# Patient Record
Sex: Male | Born: 1990 | Race: Black or African American | Hispanic: No | Marital: Single | Smoking: Current every day smoker
Health system: Southern US, Community
[De-identification: ages and names within clinical notes are randomized; demographics above are authoritative.]

---
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
## 2010-06-01 LAB — DIFFERENTIAL
Basophils Relative: 0 % (ref 0–1)
Eosinophils Absolute: 0.1 10*3/uL (ref 0.0–0.7)
Monocytes Relative: 9 % (ref 3–12)
Neutrophils Relative %: 41 % — ABNORMAL LOW (ref 43–77)

## 2010-06-01 LAB — COMPREHENSIVE METABOLIC PANEL
ALT: 17 U/L (ref 0–53)
Alkaline Phosphatase: 62 U/L (ref 39–117)
CO2: 26 mEq/L (ref 19–32)
Chloride: 106 mEq/L (ref 96–112)
GFR calc non Af Amer: >60 mL/min (ref >60)
Glucose, Bld: 92 mg/dL (ref 70–99)
Potassium: 3.9 mEq/L (ref 3.5–5.1)
Sodium: 139 mEq/L (ref 135–145)
Total Bilirubin: 1.2 mg/dL (ref 0.3–1.2)

## 2010-06-01 LAB — ETHANOL: Alcohol, Ethyl (B): <5 mg/dL (ref 0–10)

## 2010-06-01 LAB — CBC
Hemoglobin: 14.8 g/dL (ref 13.0–17.0)
RBC: 4.84 MIL/uL (ref 4.22–5.81)

## 2010-06-01 LAB — RAPID URINE DRUG SCREEN, HOSP PERFORMED: Tetrahydrocannabinol: POSITIVE — AB

## 2011-06-26 ENCOUNTER — Emergency Department: Payer: Self-pay | Admitting: *Deleted

## 2014-06-26 ENCOUNTER — Emergency Department
Admission: EM | Admit: 2014-06-26 | Discharge: 2014-06-26 | Disposition: A | Discharge status: Home / Self Care | Payer: BLUE CROSS/BLUE SHIELD | Attending: Emergency Medicine | Admitting: Emergency Medicine

## 2014-06-26 ENCOUNTER — Emergency Department: Payer: BLUE CROSS/BLUE SHIELD

## 2014-06-26 ENCOUNTER — Other Ambulatory Visit: Payer: Self-pay

## 2014-06-26 DIAGNOSIS — R079 Chest pain, unspecified: Secondary | ICD-10-CM | POA: Diagnosis not present

## 2014-06-26 DIAGNOSIS — R0602 Shortness of breath: Secondary | ICD-10-CM | POA: Insufficient documentation

## 2014-06-26 LAB — CBC
HCT: 43.6 % (ref 40.0–52.0)
HEMOGLOBIN: 14.7 g/dL (ref 13.0–18.0)
MCH: 30.6 pg (ref 26.0–34.0)
MCHC: 33.8 g/dL (ref 32.0–36.0)
MCV: 90.5 fL (ref 80.0–100.0)
Platelets: 230 10*3/uL (ref 150–440)
RBC: 4.81 MIL/uL (ref 4.40–5.90)
RDW: 13.4 % (ref 11.5–14.5)
WBC: 10 10*3/uL (ref 3.8–10.6)

## 2014-06-26 LAB — BASIC METABOLIC PANEL
ANION GAP: 14 (ref 5–15)
BUN: 8 mg/dL (ref 6–20)
CALCIUM: 9.6 mg/dL (ref 8.9–10.3)
CHLORIDE: 100 mmol/L — AB (ref 101–111)
CO2: 23 mmol/L (ref 22–32)
CREATININE: 0.91 mg/dL (ref 0.61–1.24)
GFR calc non Af Amer: >60 mL/min (ref >60)
Glucose, Bld: 128 mg/dL — ABNORMAL HIGH (ref 65–99)
Potassium: 4.2 mmol/L (ref 3.5–5.1)
Sodium: 137 mmol/L (ref 135–145)

## 2014-06-26 LAB — TROPONIN I: Troponin I: <0.03 ng/mL (ref <0.031)

## 2014-06-26 MED ORDER — ASPIRIN 81 MG PO CHEW
324.0000 mg | CHEWABLE_TABLET | Freq: Once | ORAL | Status: AC
  Administered 2014-06-26: 324 mg via ORAL

## 2014-06-26 MED ORDER — ASPIRIN 81 MG PO CHEW
CHEWABLE_TABLET | ORAL | Status: AC
  Administered 2014-06-26: 324 mg via ORAL
  Filled 2014-06-26: qty 4

## 2014-06-26 NOTE — ED Notes (Signed)
   06/26/14 0320  Neurological  Neuro ([facility]) X  Wide eyed appearance noted, bilateral arm stiffness noted. Pt will follow simple commands and answer questions.

## 2014-06-26 NOTE — Discharge Instructions (Signed)
Chest Pain Observation  Please call Dr. Val EagleArita's office for follow-up.  Return to the ER right away should her chest pain recur, you have trouble breathing or shortness of breath, he feel weak, he developed nausea vomiting or sweating, or other new concerns or symptoms arise.  It is often hard to give a specific diagnosis for the cause of chest pain. Among other possibilities your symptoms might be caused by inadequate oxygen delivery to your heart (angina). Angina that is not treated or evaluated can lead to a heart attack (myocardial infarction) or death. Blood tests, electrocardiograms, and X-rays may have been done to help determine a possible cause of your chest pain. After evaluation and observation, your health care provider has determined that it is unlikely your pain was caused by an unstable condition that requires hospitalization. However, a full evaluation of your pain may need to be completed, with additional diagnostic testing as directed. It is very important to keep your follow-up appointments. Not keeping your follow-up appointments could result in permanent heart damage, disability, or death. If there is any problem keeping your follow-up appointments, you must call your health care provider. HOME CARE INSTRUCTIONS  Due to the slight chance that your pain could be angina, it is important to follow your health care provider's treatment plan and also maintain a healthy lifestyle:  Maintain or work toward achieving a healthy weight.  Stay physically active and exercise regularly.  Decrease your salt intake.  Eat a balanced, healthy diet. Talk to a dietitian to learn about heart-healthy foods.  Increase your fiber intake by including whole grains, vegetables, fruits, and nuts in your diet.  Avoid situations that cause stress, anger, or depression.  Take medicines as advised by your health care provider. Report any side effects to your health care provider. Do not stop medicines or  adjust the dosages on your own.  Quit smoking. Do not use nicotine patches or gum until you check with your health care provider.  Keep your blood pressure, blood sugar, and cholesterol levels within normal limits.  Limit alcohol intake to no more than 1 drink per day for women who are not pregnant and 2 drinks per day for men.  Do not abuse drugs. SEEK IMMEDIATE MEDICAL CARE IF: You have severe chest pain or pressure which may include symptoms such as:  You feel pain or pressure in your arms, neck, jaw, or back.  You have severe back or abdominal pain, feel sick to your stomach (nauseous), or throw up (vomit).  You are sweating profusely.  You are having a fast or irregular heartbeat.  You feel short of breath while at rest.  You notice increasing shortness of breath during rest, sleep, or with activity.  You have chest pain that does not get better after rest or after taking your usual medicine.  You wake from sleep with chest pain.  You are unable to sleep because you cannot breathe.  You develop a frequent cough or you are coughing up blood.  You feel dizzy, faint, or experience extreme fatigue.  You develop severe weakness, dizziness, fainting, or chills. Any of these symptoms may represent a serious problem that is an emergency. Do not wait to see if the symptoms will go away. Call your local emergency services (911 in the [place] Do not drive yourself to the hospital. MAKE SURE YOU:  Understand these instructions.  Will watch your condition.  Will get help right away if you are not doing well or get worse.  Document Released: 03/11/2010 Document Revised: 02/11/2013 Document Reviewed: 08/08/2012 [facility] Patient Information 2015 [employer], [place]. This information is not intended to replace advice given to you by your health care provider. Make sure you discuss any questions you have with your health care provider.

## 2014-06-26 NOTE — ED Provider Notes (Signed)
[facility] Emergency Department Provider Note    ____________________________________________  Time seen: 3:15 AM  I have reviewed the triage vital signs and the nursing notes.   HISTORY  Chief Complaint Chest Pain and Shortness of Breath       HPI Brian Jefferson is a 24 y.o. male who began having chest pain and shortness of breath around 9 PM. Patient reports that he was feeling a little stressed out today regarding situations with unemployment and try to find work, in order to combat this CT took a single pill from a friend which she believes to be Suboxone. He then decided to go to bed, but while getting ready for bed he began to experience a sudden feeling of shortness of breath and tightness across the front of his chest which began at 9 PM. He states he was unable to sleep and became very anxious and nervous about this and presents to the ER for evaluation of this chest pain.  Patient states that he does feel this is probably related to anxiety and lots of stress in his life recently regarding finances and jobs. He denies any homicidal or suicidal thoughts and denies any self harming.  Patient states he is not a regular drug user, but thought he would try something to help him relax tonight. He feels that the Suboxone may have made him feel funny and potentially made him and neck.  Patient states that pain is gone now and his shortness of breath has resolved. He felt an achy tight sensation across the front of his chest with some mild to moderate shortness of breath that lasted about one hour. The severity was described as moderate to severe. Pressure. Denies sweating, vomiting, abdominal pain, or confusion.  On further discussion the patient states that he made a poor decision and will certainly not ever take any unprescribed medications for reasons like this in the future.   No past medical history on file. Patient denies any past medical  history  Patient does not take any medications  Patient denies any drug allergies  Patient does smoke tobacco products occasionally, he does not use any illegal substances except the Suboxone which he told me about. He does drink alcohol approximately 3-5 beers daily, has no history of withdrawal or alcoholism per patient.  There are no active problems to display for this patient.   No past surgical history on file.  No current outpatient prescriptions on file.  Allergies Review of patient's allergies indicates no known allergies.  No family history on file.  Social History History  Substance Use Topics  . Smoking status: Not on file  . Smokeless tobacco: Not on file  . Alcohol Use: Not on file    Review of Systems  Constitutional: Negative for fever. Eyes: Negative for visual changes. ENT: Negative for sore throat. Cardiovascular: See history of present illness Respiratory: See history of present illness Gastrointestinal: Negative for abdominal pain, vomiting and diarrhea. Genitourinary: Negative for dysuria. Musculoskeletal: Negative for back pain. Skin: Negative for rash. Neurological: Negative for headaches, focal weakness or numbness. PERC negative review of systems also.  10-point ROS otherwise negative.  ____________________________________________   PHYSICAL EXAM:  VITAL SIGNS: ED Triage Vitals  Enc Vitals Group     BP 06/26/14 0308 165/110 mmHg     Pulse Rate 06/26/14 0308 85     Resp 06/26/14 0308 22     Temp 06/26/14 0308 97.9 F (36.6 C)     Temp Source  06/26/14 0308 Oral     SpO2 06/26/14 0308 99 %     Weight 06/26/14 0308 195 lb (88.451 kg)     Height 06/26/14 0308  (1.753 m)     Head Cir --      Peak Flow --      Pain Score 06/26/14 0309 9     Pain Loc --      Pain Edu? --      Excl. in GC? --     Constitutional: Alert and oriented. Well appearing and in no distress. Patient does appear slightly anxious at this time and admits  that he does feel a little anxious. Eyes: Conjunctivae are normal. PERRL. Normal extraocular movements. ENT   Head: Normocephalic and atraumatic.   Nose: No congestion/rhinnorhea.   Mouth/Throat: Mucous membranes are moist.   Neck: No stridor. Hematological/Lymphatic/Immunilogical: No cervical lymphadenopathy. Cardiovascular: Normal rate, regular rhythm. Normal and symmetric distal pulses are present in all extremities. No murmurs, rubs, or gallops. Respiratory: Normal respiratory effort without tachypnea nor retractions. Breath sounds are clear and equal bilaterally. No wheezes/rales/rhonchi. Gastrointestinal: Soft and nontender. No distention. No abdominal bruits. There is no CVA tenderness. Genitourinary:  Musculoskeletal: Nontender with normal range of motion in all extremities. No joint effusions.  No lower extremity tenderness nor edema. Neurologic:  Normal speech and language except for occasional stuttering, which patient states is chronic.Marland Kitchen No gross focal neurologic deficits are appreciated. Speech is normal. No gait instability. Skin:  Skin is warm, dry and intact. No rash noted. Psychiatric: Mood and affect are normal. Speech and behavior are normal. Patient exhibits appropriate insight and judgment.  ____________________________________________    LABS (pertinent positives/negatives)  Results for orders placed or performed during the hospital encounter of 06/26/14  CBC  Result Value Ref Range   WBC 10.0 3.8 - 10.6 K/uL   RBC 4.81 4.40 - 5.90 MIL/uL   Hemoglobin 14.7 13.0 - 18.0 g/dL   HCT 16.1 09.6 - 04.5 %   MCV 90.5 80.0 - 100.0 fL   MCH 30.6 26.0 - 34.0 pg   MCHC 33.8 32.0 - 36.0 g/dL   RDW 40.9 81.1 - 91.4 %   Platelets 230 150 - 440 K/uL  Basic metabolic panel  Result Value Ref Range   Sodium 137 135 - 145 mmol/L   Potassium 4.2 3.5 - 5.1 mmol/L   Chloride 100 (L) 101 - 111 mmol/L   CO2 23 22 - 32 mmol/L   Glucose, Bld 128 (H) 65 - 99 mg/dL   BUN  8 6 - 20 mg/dL   Creatinine, Ser 7.82 0.61 - 1.24 mg/dL   Calcium 9.6 8.9 - 95.6 mg/dL   GFR calc non Af Amer >60 >60 mL/min   GFR calc Af Amer >60 >60 mL/min   Anion gap 14 5 - 15  Troponin I  Result Value Ref Range   Troponin I <0.03 <0.031 ng/mL     ____________________________________________   EKG  EKG is very normal. Normal sinus rhythm normal intervals normal axis, ventricular rate 78 PR interval 124 QRS 80 QTc is 420 there are no ischemic changes noted.  ____________________________________________    RADIOLOGY  Normal chest film   ____________________________________________   PROCEDURES  Procedure(s) performed: None  Critical Care performed: No  ____________________________________________   INITIAL IMPRESSION / ASSESSMENT AND PLAN / ED COURSE  Pertinent labs & imaging results that were available during my care of the patient were reviewed by me and considered in my medical decision  making (see chart for details).  Patient presents with acute onset of brief and self resolving chest tightness and dyspnea in the setting of feeling stressed out and abusing a single Suboxone tablet.  Patient denies strong family history of coronary disease, has no personal history of coronary disease, his chest pain is completely resolved at 3:40 AM in the ER, he is noted to have a normal EKG.  Patient is in no apparent distress and well-appearing, asymptomatic in the ER at this time. Patient is low risk by heart score for coronary disease with a score less than 3, given a normal troponin which is currently pending.  The patient is perk negative has no evidence of respiratory distress, he is well-appearing. No evidence to support pulmonary embolism with a PE RC negative review of systems.  My plan will be to check a troponin and continued observation in the ER. If troponin is normal we will discharge him and I did discuss with him follow-up with outpatient cardiology. Patient  is agreeable to this plan.  I discussed with the patient the return precautions are very important and should his chest pain or shortness of breath recur or he would have other symptoms like fever or lightheadedness cough or other concerns he should return to the ER right away for further evaluation. Patient is agreeable.  ----------------------------------------- 4:13 AM on 06/26/2014 -----------------------------------------  Patient remains asymptomatic this time. Patient's labs are notably normal, his troponin was normal. EKG is normal.  I called and discussed with [facility] cardiology MD on-call, reviewed patient presentation past medical history, labs in the ER, EKG. As patient is low risk and presently asymptomatic we'll have him follow-up as an outpatient with cardiology for evaluation in their clinic.    ____________________________________________   FINAL CLINICAL IMPRESSION(S) / ED DIAGNOSES  Final diagnoses:  Chest pain, unspecified chest pain type     Sharyn CreamerMark Quale, MD 07/23/14 0050

## 2014-06-26 NOTE — ED Notes (Signed)
   06/26/14 0320  Cardiac  Cardiac ([facility]) X  Cardiac Rhythm NSR  Pt c/o "pounding in chest" after taking Suboxone pill at 5pm. Pt reports buying pill from someone. Pt states he has never taken this drug before. Pt c/o "feeling anxious and short of breath.

## 2014-06-26 NOTE — ED Notes (Signed)
Pt presents to ED with c/o chest pressure and sob for the past hour. Pt states he felt like his heart was "pounding hard" in his chest. Pt currently has no increased work of breathing or acute distress noted. Skin warm and dry. Pt states he is "Scared"; appears anxious. No hx of the same.

## 2014-07-28 ENCOUNTER — Ambulatory Visit: Payer: BLUE CROSS/BLUE SHIELD | Admitting: Cardiovascular Disease

## 2014-08-13 ENCOUNTER — Emergency Department: Payer: BLUE CROSS/BLUE SHIELD

## 2014-08-13 ENCOUNTER — Encounter: Payer: Self-pay | Admitting: Urgent Care

## 2014-08-13 ENCOUNTER — Emergency Department
Admission: EM | Admit: 2014-08-13 | Discharge: 2014-08-13 | Disposition: A | Discharge status: Home / Self Care | Payer: BLUE CROSS/BLUE SHIELD | Attending: Emergency Medicine | Admitting: Emergency Medicine

## 2014-08-13 ENCOUNTER — Other Ambulatory Visit: Payer: Self-pay

## 2014-08-13 DIAGNOSIS — Z720 Tobacco use: Secondary | ICD-10-CM | POA: Insufficient documentation

## 2014-08-13 DIAGNOSIS — F1010 Alcohol abuse, uncomplicated: Secondary | ICD-10-CM | POA: Insufficient documentation

## 2014-08-13 DIAGNOSIS — F32A Depression, unspecified: Secondary | ICD-10-CM

## 2014-08-13 DIAGNOSIS — R609 Edema, unspecified: Secondary | ICD-10-CM

## 2014-08-13 DIAGNOSIS — F329 Major depressive disorder, single episode, unspecified: Secondary | ICD-10-CM | POA: Diagnosis not present

## 2014-08-13 LAB — CBC WITH DIFFERENTIAL/PLATELET
BASOS ABS: 0 10*3/uL (ref 0–0.1)
Basophils Relative: 1 %
Eosinophils Absolute: 0.4 10*3/uL (ref 0–0.7)
Eosinophils Relative: 5 %
HEMATOCRIT: 41.5 % (ref 40.0–52.0)
HEMOGLOBIN: 13.5 g/dL (ref 13.0–18.0)
LYMPHS PCT: 42 %
Lymphs Abs: 3 10*3/uL (ref 1.0–3.6)
MCH: 30.1 pg (ref 26.0–34.0)
MCHC: 32.5 g/dL (ref 32.0–36.0)
MCV: 92.3 fL (ref 80.0–100.0)
MONO ABS: 0.7 10*3/uL (ref 0.2–1.0)
MONOS PCT: 10 %
NEUTROS ABS: 3.1 10*3/uL (ref 1.4–6.5)
Neutrophils Relative %: 42 %
Platelets: 194 10*3/uL (ref 150–440)
RBC: 4.49 MIL/uL (ref 4.40–5.90)
RDW: 13.4 % (ref 11.5–14.5)
WBC: 7.2 10*3/uL (ref 3.8–10.6)

## 2014-08-13 LAB — TROPONIN I: Troponin I: <0.03 ng/mL (ref <0.031)

## 2014-08-13 LAB — COMPREHENSIVE METABOLIC PANEL
ALK PHOS: 84 U/L (ref 38–126)
ALT: 72 U/L — ABNORMAL HIGH (ref 17–63)
ANION GAP: 5 (ref 5–15)
AST: 58 U/L — ABNORMAL HIGH (ref 15–41)
Albumin: 4 g/dL (ref 3.5–5.0)
BILIRUBIN TOTAL: 0.8 mg/dL (ref 0.3–1.2)
BUN: 7 mg/dL (ref 6–20)
CHLORIDE: 103 mmol/L (ref 101–111)
CO2: 29 mmol/L (ref 22–32)
CREATININE: 0.96 mg/dL (ref 0.61–1.24)
Calcium: 9.1 mg/dL (ref 8.9–10.3)
Glucose, Bld: 100 mg/dL — ABNORMAL HIGH (ref 65–99)
POTASSIUM: 4.2 mmol/L (ref 3.5–5.1)
Sodium: 137 mmol/L (ref 135–145)
Total Protein: 7.8 g/dL (ref 6.5–8.1)

## 2014-08-13 LAB — URINALYSIS COMPLETE WITH MICROSCOPIC (ARMC ONLY)
BACTERIA UA: NONE SEEN
Bilirubin Urine: NEGATIVE
Glucose, UA: NEGATIVE mg/dL
HGB URINE DIPSTICK: NEGATIVE
KETONES UR: NEGATIVE mg/dL
Leukocytes, UA: NEGATIVE
Nitrite: NEGATIVE
PROTEIN: NEGATIVE mg/dL
RBC / HPF: NONE SEEN RBC/hpf (ref 0–5)
SPECIFIC GRAVITY, URINE: 1.016 (ref 1.005–1.030)
Squamous Epithelial / LPF: NONE SEEN
pH: 5 (ref 5.0–8.0)

## 2014-08-13 LAB — PROTIME-INR
INR: 0.91
Prothrombin Time: 12.5 seconds (ref 11.4–15.0)

## 2014-08-13 LAB — BRAIN NATRIURETIC PEPTIDE: B Natriuretic Peptide: 108 pg/mL — ABNORMAL HIGH (ref 0.0–100.0)

## 2014-08-13 LAB — URIC ACID: URIC ACID, SERUM: 7.3 mg/dL (ref 4.4–7.6)

## 2014-08-13 MED ORDER — FUROSEMIDE 20 MG PO TABS: 20.0000 mg | ORAL_TABLET | Freq: Every day | ORAL | Status: AC

## 2014-08-13 NOTE — ED Notes (Signed)
Patient presents with c/o 1-2 months of his feet and ankles swelling. NOS reported at this time.

## 2014-08-13 NOTE — ED Notes (Signed)
Spoke with Ladona Ridgel, MD regarding patient's presenting c/o and triage assessment. MD with no orders at this time.

## 2014-08-13 NOTE — Discharge Instructions (Signed)
Alcohol Use Disorder °Alcohol use disorder is a mental disorder. It is not a one-time incident of heavy drinking. Alcohol use disorder is the excessive and uncontrollable use of alcohol over time that leads to problems with functioning in one or more areas of daily living. People with this disorder risk harming themselves and others when they drink to excess. Alcohol use disorder also can cause other mental disorders, such as mood and anxiety disorders, and serious physical problems. People with alcohol use disorder often misuse other drugs.  °Alcohol use disorder is common and widespread. Some people with this disorder drink alcohol to cope with or escape from negative life events. Others drink to relieve chronic pain or symptoms of mental illness. People with a family history of alcohol use disorder are at higher risk of losing control and using alcohol to excess.  °SYMPTOMS  °Signs and symptoms of alcohol use disorder may include the following:  °· Consumption of alcohol in larger amounts or over a longer period of time than intended. °· Multiple unsuccessful attempts to cut down or control alcohol use.   °· A great deal of time spent obtaining alcohol, using alcohol, or recovering from the effects of alcohol (hangover). °· A strong desire or urge to use alcohol (cravings).   °· Continued use of alcohol despite problems at work, school, or home because of alcohol use.   °· Continued use of alcohol despite problems in relationships because of alcohol use. °· Continued use of alcohol in situations when it is physically hazardous, such as driving a car. °· Continued use of alcohol despite awareness of a physical or psychological problem that is likely related to alcohol use. Physical problems related to alcohol use can involve the brain, heart, liver, stomach, and intestines. Psychological problems related to alcohol use include intoxication, depression, anxiety, psychosis, delirium, and dementia.   °· The need for  increased amounts of alcohol to achieve the same desired effect, or a decreased effect from the consumption of the same amount of alcohol (tolerance). °· Withdrawal symptoms upon reducing or stopping alcohol use, or alcohol use to reduce or avoid withdrawal symptoms. Withdrawal symptoms include: °· Racing heart. °· Hand tremor. °· Difficulty sleeping. °· Nausea. °· Vomiting. °· Hallucinations. °· Restlessness. °· Seizures. °DIAGNOSIS °Alcohol use disorder is diagnosed through an assessment by your health care provider. Your health care provider may start by asking three or four questions to screen for excessive or problematic alcohol use. To confirm a diagnosis of alcohol use disorder, at least two symptoms must be present within a 12-month period. The severity of alcohol use disorder depends on the number of symptoms: °· Mild--two or three. °· Moderate--four or five. °· Severe--six or more. °Your health care provider may perform a physical exam or use results from lab tests to see if you have physical problems resulting from alcohol use. Your health care provider may refer you to a mental health professional for evaluation. °TREATMENT  °Some people with alcohol use disorder are able to reduce their alcohol use to low-risk levels. Some people with alcohol use disorder need to quit drinking alcohol. When necessary, mental health professionals with specialized training in substance use treatment can help. Your health care provider can help you decide how severe your alcohol use disorder is and what type of treatment you need. The following forms of treatment are available:  °· Detoxification. Detoxification involves the use of prescription medicines to prevent alcohol withdrawal symptoms in the first week after quitting. This is important for people with a history of symptoms   of withdrawal and for heavy drinkers who are likely to have withdrawal symptoms. Alcohol withdrawal can be dangerous and, in severe cases, cause  death. Detoxification is usually provided in a hospital or in-patient substance use treatment facility.  Counseling or talk therapy. Talk therapy is provided by substance use treatment counselors. It addresses the reasons people use alcohol and ways to keep them from drinking again. The goals of talk therapy are to help people with alcohol use disorder find healthy activities and ways to cope with life stress, to identify and avoid triggers for alcohol use, and to handle cravings, which can cause relapse.  Medicines.Different medicines can help treat alcohol use disorder through the following actions:  Decrease alcohol cravings.  Decrease the positive reward response felt from alcohol use.  Produce an uncomfortable physical reaction when alcohol is used (aversion therapy).  Support groups. Support groups are run by people who have quit drinking. They provide emotional support, advice, and guidance. These forms of treatment are often combined. Some people with alcohol use disorder benefit from intensive combination treatment provided by specialized substance use treatment centers. Both inpatient and outpatient treatment programs are available. Document Released: 03/16/2004 Document Revised: 06/23/2013 Document Reviewed: 05/16/2012 [facility] Patient Information 2015 [place], [place]. This information is not intended to replace advice given to you by your health care provider. Make sure you discuss any questions you have with your health care provider.  Depression Depression refers to feeling sad, low, down in the dumps, blue, gloomy, or empty. In general, there are two kinds of depression:  Normal sadness or normal grief. This kind of depression is one that we all feel from time to time after upsetting life experiences, such as the loss of a job or the ending of a relationship. This kind of depression is considered normal, is short lived, and resolves within a few days to 2 weeks. Depression  experienced after the loss of a loved one (bereavement) often lasts longer than 2 weeks but normally gets better with time.  Clinical depression. This kind of depression lasts longer than normal sadness or normal grief or interferes with your ability to function at home, at work, and in school. It also interferes with your personal relationships. It affects almost every aspect of your life. Clinical depression is an illness. Symptoms of depression can also be caused by conditions other than those mentioned above, such as:  Physical illness. Some physical illnesses, including underactive thyroid gland (hypothyroidism), severe anemia, specific types of cancer, diabetes, uncontrolled seizures, heart and lung problems, strokes, and chronic pain are commonly associated with symptoms of depression.  Side effects of some prescription medicine. In some people, certain types of medicine can cause symptoms of depression.  Substance abuse. Abuse of alcohol and illicit drugs can cause symptoms of depression. SYMPTOMS Symptoms of normal sadness and normal grief include the following:  Feeling sad or crying for short periods of time.  Not caring about anything (apathy).  Difficulty sleeping or sleeping too much.  No longer able to enjoy the things you used to enjoy.  Desire to be by oneself all the time (social isolation).  Lack of energy or motivation.  Difficulty concentrating or remembering.  Change in appetite or weight.  Restlessness or agitation. Symptoms of clinical depression include the same symptoms of normal sadness or normal grief and also the following symptoms:  Feeling sad or crying all the time.  Feelings of guilt or worthlessness.  Feelings of hopelessness or helplessness.  Thoughts of suicide or  the desire to harm yourself (suicidal ideation).  Loss of touch with reality (psychotic symptoms). Seeing or hearing things that are not real (hallucinations) or having false  beliefs about your life or the people around you (delusions and paranoia). DIAGNOSIS  The diagnosis of clinical depression is usually based on how bad the symptoms are and how long they have lasted. Your health care provider will also ask you questions about your medical history and substance use to find out if physical illness, use of prescription medicine, or substance abuse is causing your depression. Your health care provider may also order blood tests. TREATMENT  Often, normal sadness and normal grief do not require treatment. However, sometimes antidepressant medicine is given for bereavement to ease the depressive symptoms until they resolve. The treatment for clinical depression depends on how bad the symptoms are but often includes antidepressant medicine, counseling with a mental health professional, or both. Your health care provider will help to determine what treatment is best for you. Depression caused by physical illness usually goes away with appropriate medical treatment of the illness. If prescription medicine is causing depression, talk with your health care provider about stopping the medicine, decreasing the dose, or changing to another medicine. Depression caused by the abuse of alcohol or illicit drugs goes away when you stop using these substances. Some adults need professional help in order to stop drinking or using drugs. SEEK IMMEDIATE MEDICAL CARE IF:  You have thoughts about hurting yourself or others.  You lose touch with reality (have psychotic symptoms).  You are taking medicine for depression and have a serious side effect. FOR MORE INFORMATION  National Alliance on Mental Illness: www.nami.[employer] of Mental Health: http://www.maynard.net/ Document Released: 02/04/2000 Document Revised: 06/23/2013 Document Reviewed: 05/08/2011 [facility] Patient Information 2015 [place], [place]. This information is not intended to replace advice given to you by your  health care provider. Make sure you discuss any questions you have with your health care provider.  Peripheral Edema You have swelling in your legs (peripheral edema). This swelling is due to excess accumulation of salt and water in your body. Edema may be a sign of heart, kidney or liver disease, or a side effect of a medication. It may also be due to problems in the leg veins. Elevating your legs and using special support stockings may be very helpful, if the cause of the swelling is due to poor venous circulation. Avoid long periods of standing, whatever the cause. Treatment of edema depends on identifying the cause. Chips, pretzels, pickles and other salty foods should be avoided. Restricting salt in your diet is almost always needed. Water pills (diuretics) are often used to remove the excess salt and water from your body via urine. These medicines prevent the kidney from reabsorbing sodium. This increases urine flow. Diuretic treatment may also result in lowering of potassium levels in your body. Potassium supplements may be needed if you have to use diuretics daily. Daily weights can help you keep track of your progress in clearing your edema. You should call your caregiver for follow up care as recommended. SEEK IMMEDIATE MEDICAL CARE IF:   You have increased swelling, pain, redness, or heat in your legs.  You develop shortness of breath, especially when lying down.  You develop chest or abdominal pain, weakness, or fainting.  You have a fever. Document Released: 03/16/2004 Document Revised: 05/01/2011 Document Reviewed: 02/24/2009 [facility] Patient Information 2015 [place], [place]. This information is not intended to replace advice given to  you by your health care provider. Make sure you discuss any questions you have with your health care provider.

## 2014-08-13 NOTE — ED Provider Notes (Signed)
[facility] Emergency Department Provider Note     Time seen: ----------------------------------------- 8:03 AM on 08/13/2014 -----------------------------------------    I have reviewed the triage vital signs and the nursing notes.   HISTORY  Chief Complaint Foot Swelling    HPI Brian Jefferson is a 24 y.o. male since ER for bilateral lower extremity swelling over the last month. Patient states it comes and goes, does have some shortness of breath and chest pain. Patient denies fevers chills, nothing sees make his symptoms better or worse. States he also drinks 2, 40 ounce beers per day.   History reviewed. No pertinent past medical history.  There are no active problems to display for this patient.   History reviewed. No pertinent past surgical history.  Allergies Review of patient's allergies indicates no known allergies.  Social History History  Substance Use Topics  . Smoking status: Current Every Day Smoker  . Smokeless tobacco: Not on file  . Alcohol Use: Yes    Review of Systems Constitutional: Negative for fever. Eyes: Negative for visual changes. ENT: Negative for sore throat. Cardiovascular: Negative for chest pain. Respiratory: Negative for shortness of breath. Gastrointestinal: Negative for abdominal pain, vomiting and diarrhea. Genitourinary: Negative for dysuria. Musculoskeletal: Negative for back pain. Positive for leg swelling bilaterally Skin: Negative for rash. Neurological: Negative for headaches, focal weakness or numbness.  10-point ROS otherwise negative.  ____________________________________________   PHYSICAL EXAM:  VITAL SIGNS: ED Triage Vitals  Enc Vitals Group     BP 08/13/14 0441 153/117 mmHg     Pulse Rate 08/13/14 0441 73     Resp 08/13/14 0441 18     Temp 08/13/14 0441 98 F (36.7 C)     Temp Source 08/13/14 0441 Oral     SpO2 08/13/14 0441 98 %     Weight 08/13/14 0441 195 lb (88.451 kg)    Height 08/13/14 0441  (1.753 m)     Head Cir --      Peak Flow --      Pain Score 08/13/14 0441 3     Pain Loc --      Pain Edu? --      Excl. in GC? --     Constitutional: Alert and oriented. Well appearing and in no distress. Eyes: Conjunctivae are normal. PERRL. Normal extraocular movements. ENT   Head: Normocephalic and atraumatic.   Nose: No congestion/rhinnorhea.   Mouth/Throat: Mucous membranes are moist.   Neck: No stridor. Cardiovascular: Normal rate, regular rhythm. Normal and symmetric distal pulses are present in all extremities. No murmurs, rubs, or gallops. Respiratory: Normal respiratory effort without tachypnea nor retractions. Breath sounds are clear and equal bilaterally. No wheezes/rales/rhonchi. Gastrointestinal: Soft and nontender. No distention. No abdominal bruits. There is no CVA tenderness. Musculoskeletal: Nontender with normal range of motion in all extremities. No joint effusions.  Bilateral pitting edema is noted to just above the ankles. Neurologic:  Normal speech and language. No gross focal neurologic deficits are appreciated. Speech is normal. No gait instability. Skin:  Skin is warm, dry and intact. No rash noted. Psychiatric: Depressed mood and affect. Speech and behavior are normal. Patient exhibits appropriate insight and judgment. ____________________________________________  EKG: Interpreted by me. Normal sinus rhythm, with normal axis normal intervals, no evidence of hypertrophy or acute infarction. Rate is 65  ____________________________________________  ED COURSE:  Pertinent labs & imaging results that were available during my care of the patient were reviewed by me and considered in my medical  decision making (see chart for details).  ____________________________________________    LABS (pertinent positives/negatives)  Labs Reviewed  COMPREHENSIVE METABOLIC PANEL - Abnormal; Notable for the following:    Glucose,  Bld 100 (*)    AST 58 (*)    ALT 72 (*)    All other components within normal limits  URINALYSIS COMPLETEWITH MICROSCOPIC ([facility] ONLY) - Abnormal; Notable for the following:    Color, Urine YELLOW (*)    APPearance CLEAR (*)    All other components within normal limits  CBC WITH DIFFERENTIAL/PLATELET  PROTIME-INR  URIC ACID  TROPONIN I  BRAIN NATRIURETIC PEPTIDE    RADIOLOGY US IMPRESSION: No evidence of deep venous thrombosis.  CXR IMPRESSION: Normal exam. ____________________________________________  FINAL ASSESSMENT AND PLAN  Edema and alcohol abuse  Plan: Unclear etiology for edema. Patient will be started on low dose Lasix and encouraged to follow-up with his doctor. States that his blood pressure checked before his been normal, unclear if this is alcohol withdrawal related or not. I did offer him detox which she declines at this time. Advised follow-up in the next week for reevaluation.   Emily Filbert, MD   Emily Filbert, MD 08/13/14 [phone]

## 2014-08-13 NOTE — ED Notes (Signed)
Pt arrives with complaints of bilateral lower extremity swelling, pt states the swelling is coming and going, pt states some SOB

## 2014-09-07 ENCOUNTER — Ambulatory Visit: Payer: BLUE CROSS/BLUE SHIELD | Admitting: Cardiovascular Disease

## 2016-07-31 IMAGING — CR DG CHEST 2V
1 series · 2 of 2 positions shown · non-contrast
Comparison: 06/26/2014

CLINICAL DATA: Feet swelling for 1 month, smoker

EXAM:
CHEST  2 VIEW

[Series 1: dg chest 2 view · 0.14mm/px · 2 of 2 slices shown]
[im 1/2]
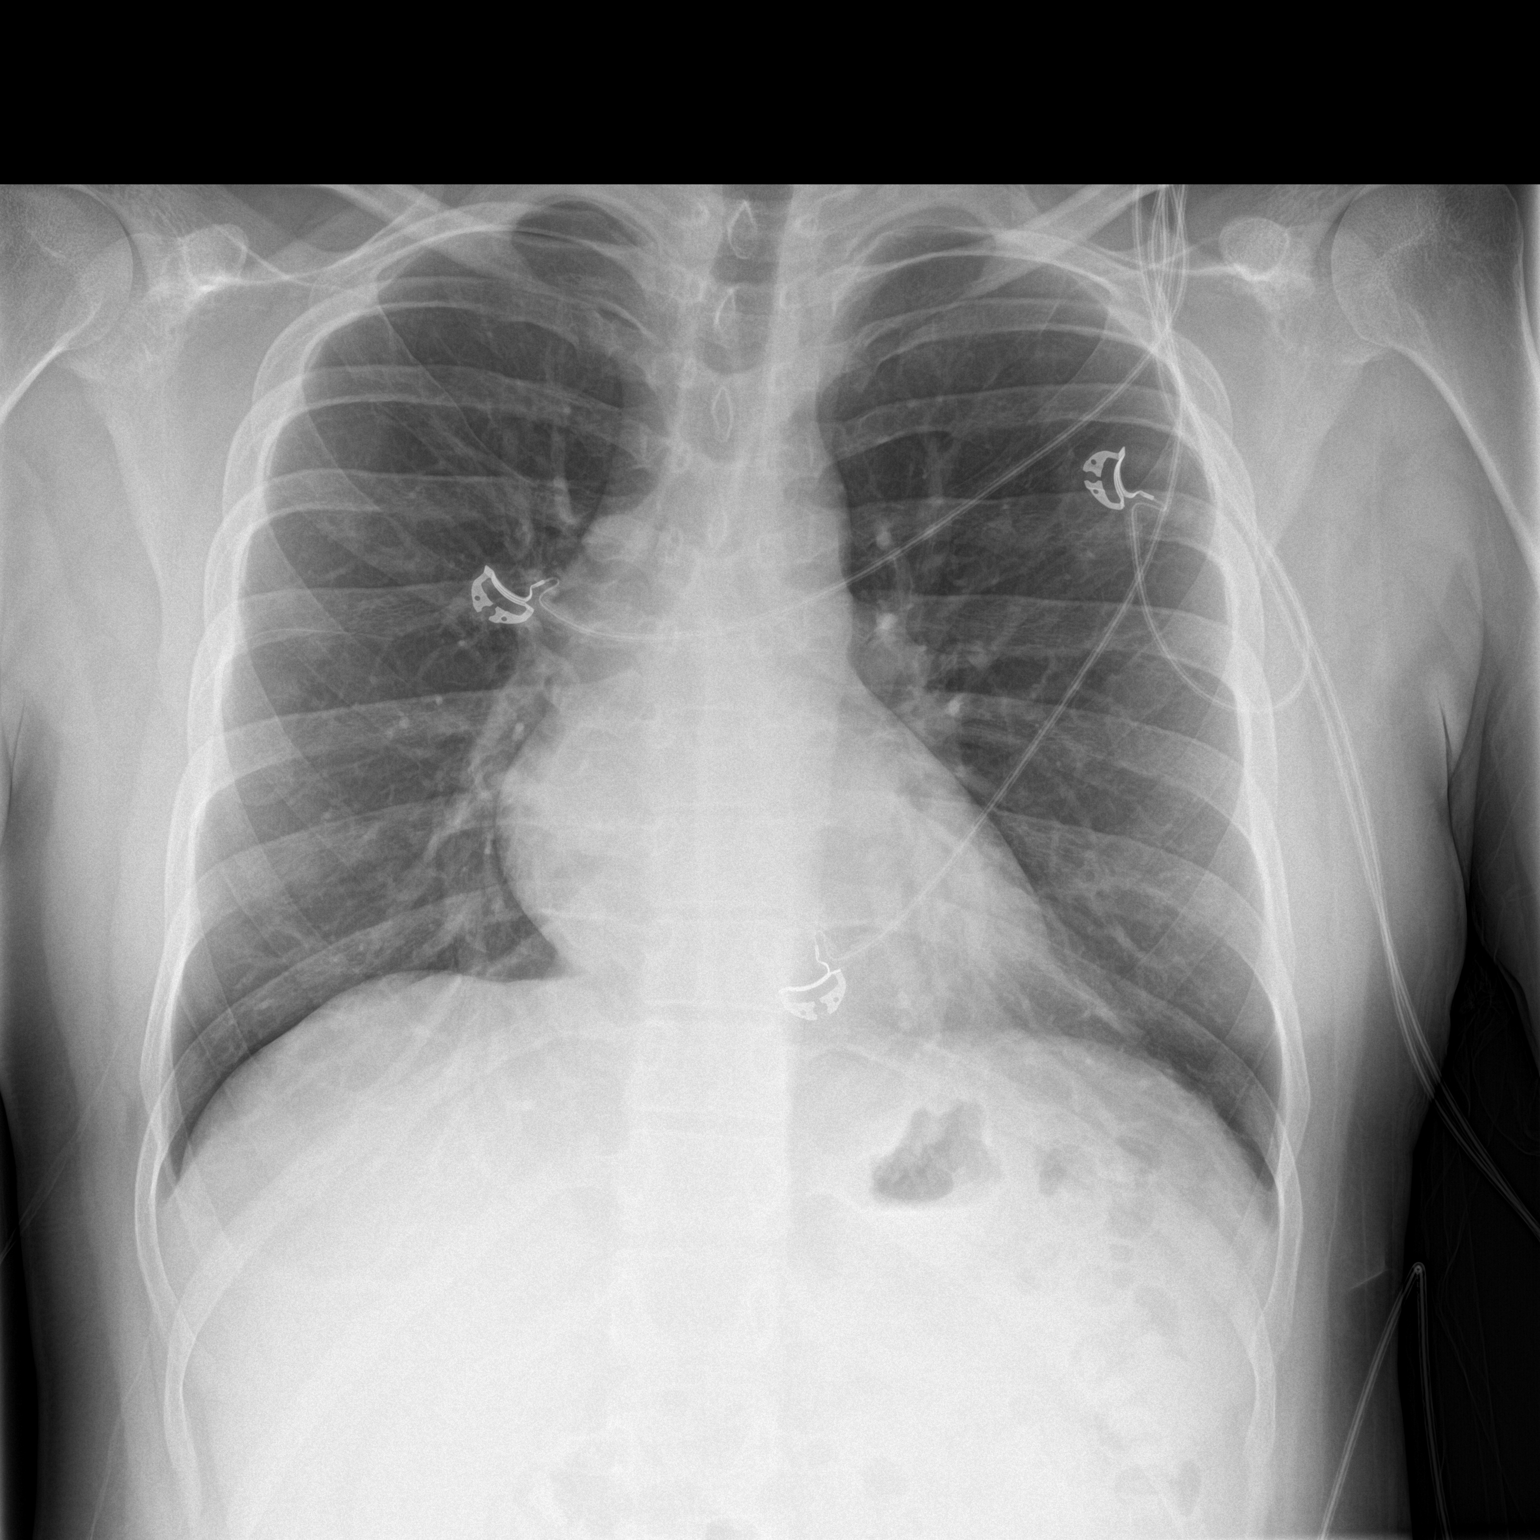
[im 2/2]
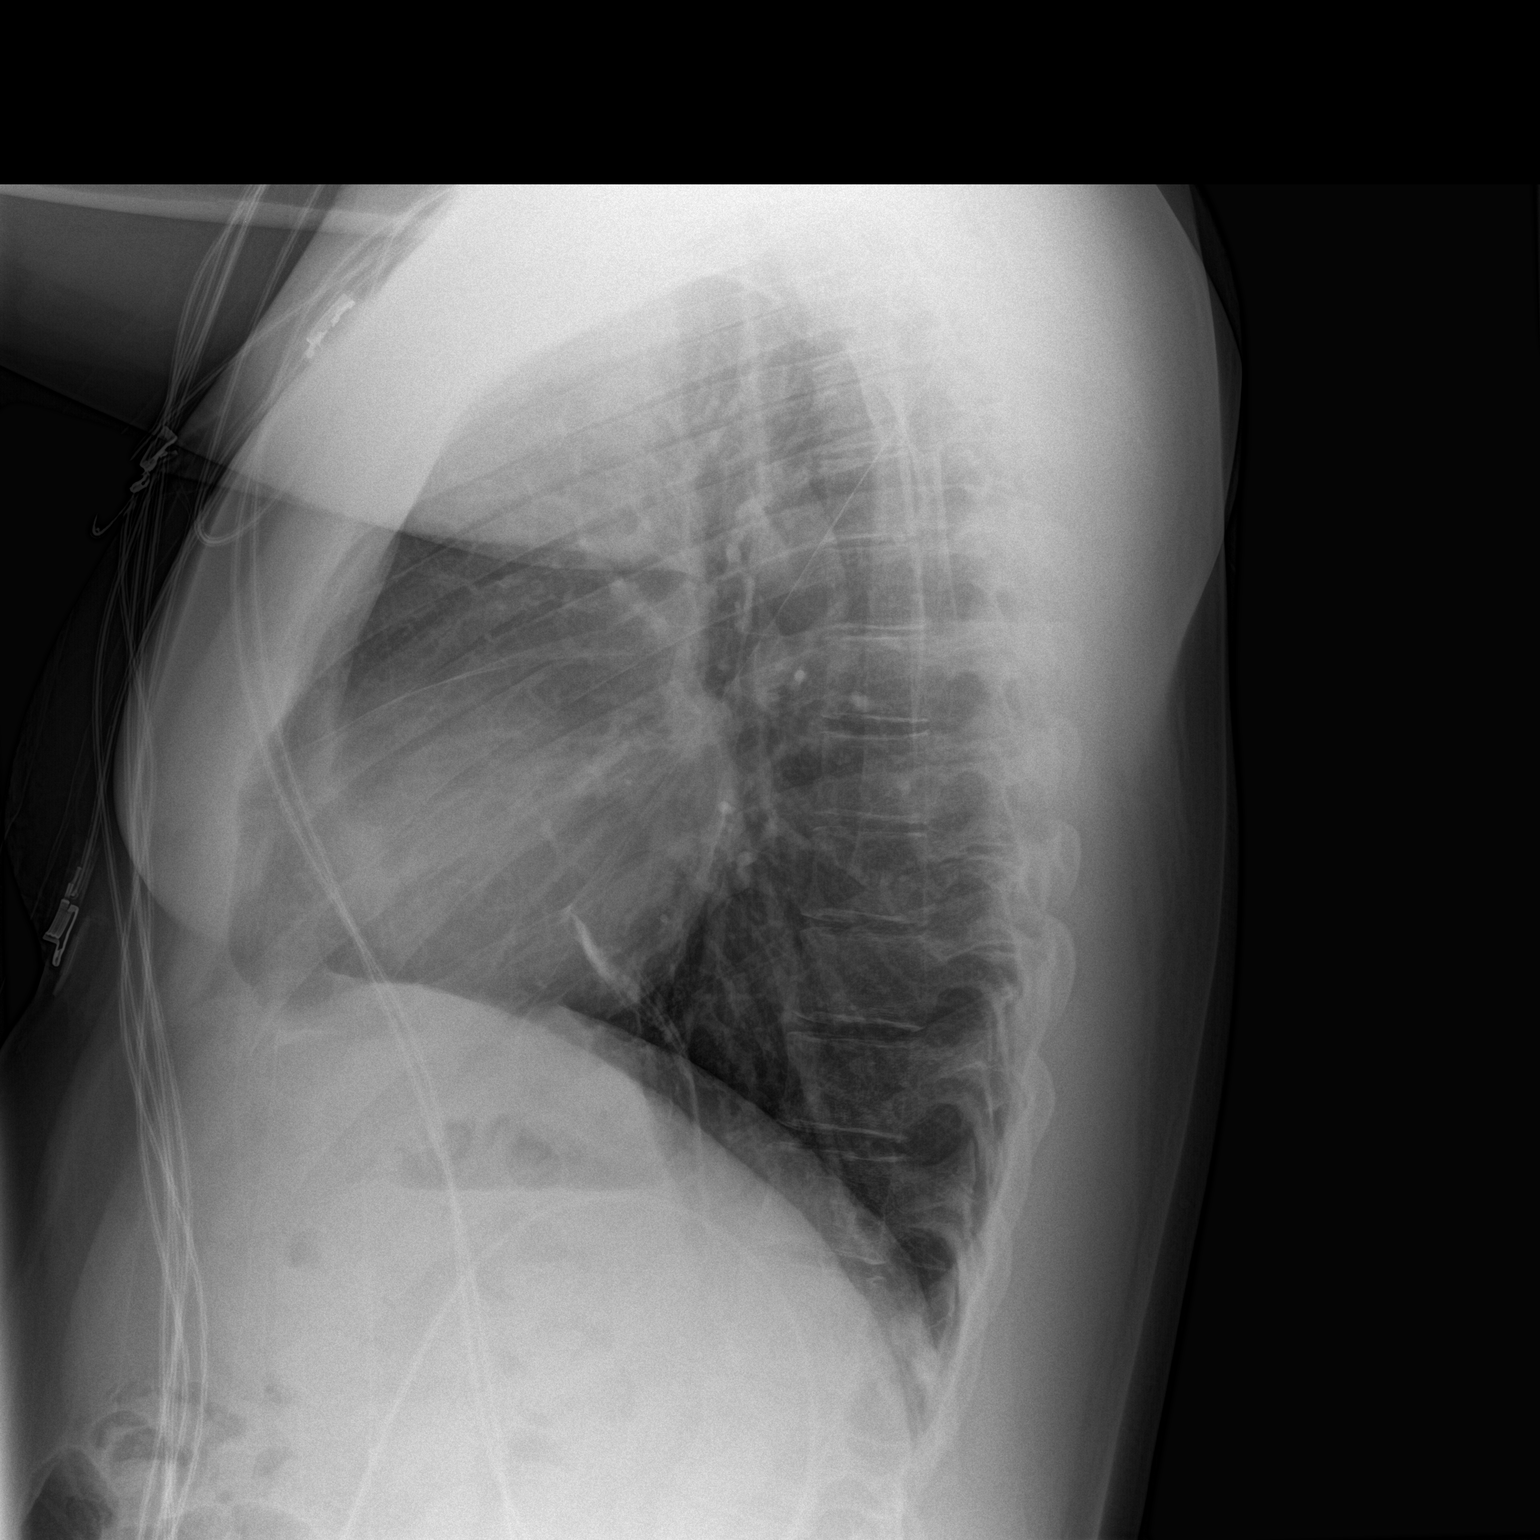

[2 of 2 positions shown; findings below may reference images not displayed]

FINDINGS: Normal heart size, mediastinal contours and pulmonary vascularity.

Lungs clear.

No pleural effusion or pneumothorax.

Bones unremarkable.
IMPRESSION: Normal exam.

## 2019-10-22 DEATH — deceased
